# Patient Record
Sex: Female | Born: 1980 | Race: White | Hispanic: No | Smoking: Current every day smoker
Health system: Southern US, Community
[De-identification: ages and names within clinical notes are randomized; demographics above are authoritative.]

## PROBLEM LIST (undated history)

## (undated) DIAGNOSIS — F319 Bipolar disorder, unspecified: Secondary | ICD-10-CM

## (undated) DIAGNOSIS — F329 Major depressive disorder, single episode, unspecified: Secondary | ICD-10-CM

## (undated) DIAGNOSIS — F32A Depression, unspecified: Secondary | ICD-10-CM

## (undated) HISTORY — PX: OTHER SURGICAL HISTORY: SHX169

## (undated) HISTORY — PX: MANDIBLE FRACTURE SURGERY: SHX706

---

## 2001-06-10 ENCOUNTER — Other Ambulatory Visit: Admission: RE | Admit: 2001-06-10 | Discharge: 2001-06-10 | Payer: Self-pay

## 2004-05-09 ENCOUNTER — Other Ambulatory Visit: Admission: RE | Admit: 2004-05-09 | Discharge: 2004-05-09 | Payer: Self-pay

## 2008-08-21 ENCOUNTER — Emergency Department (HOSPITAL_COMMUNITY): Admission: EM | Admit: 2008-08-21 | Discharge: 2008-08-21 | Payer: Self-pay | Admitting: Emergency Medicine

## 2010-03-19 ENCOUNTER — Encounter: Payer: Self-pay | Admitting: *Deleted

## 2010-06-07 IMAGING — CR DG RIBS W/ CHEST 3+V*L*
4 series · 4 of 4 positions shown · non-contrast
Comparison: None.

CLINICAL DATA: Back and rib pain

LEFT RIBS AND CHEST - 3+ VIEW

[view not recorded (1 of 4)]
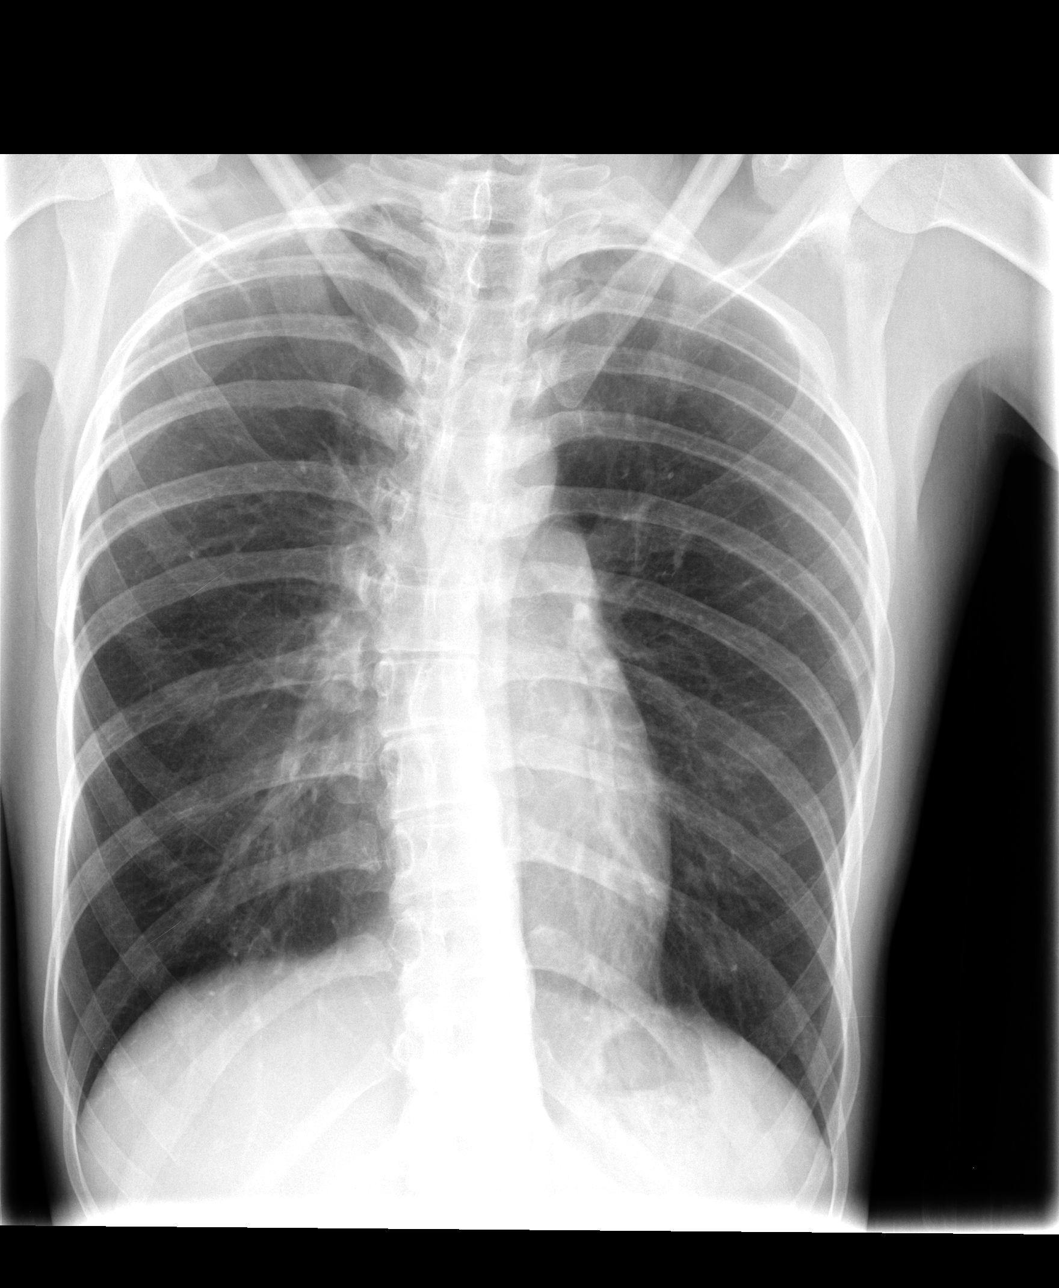

[view not recorded (2 of 4)]
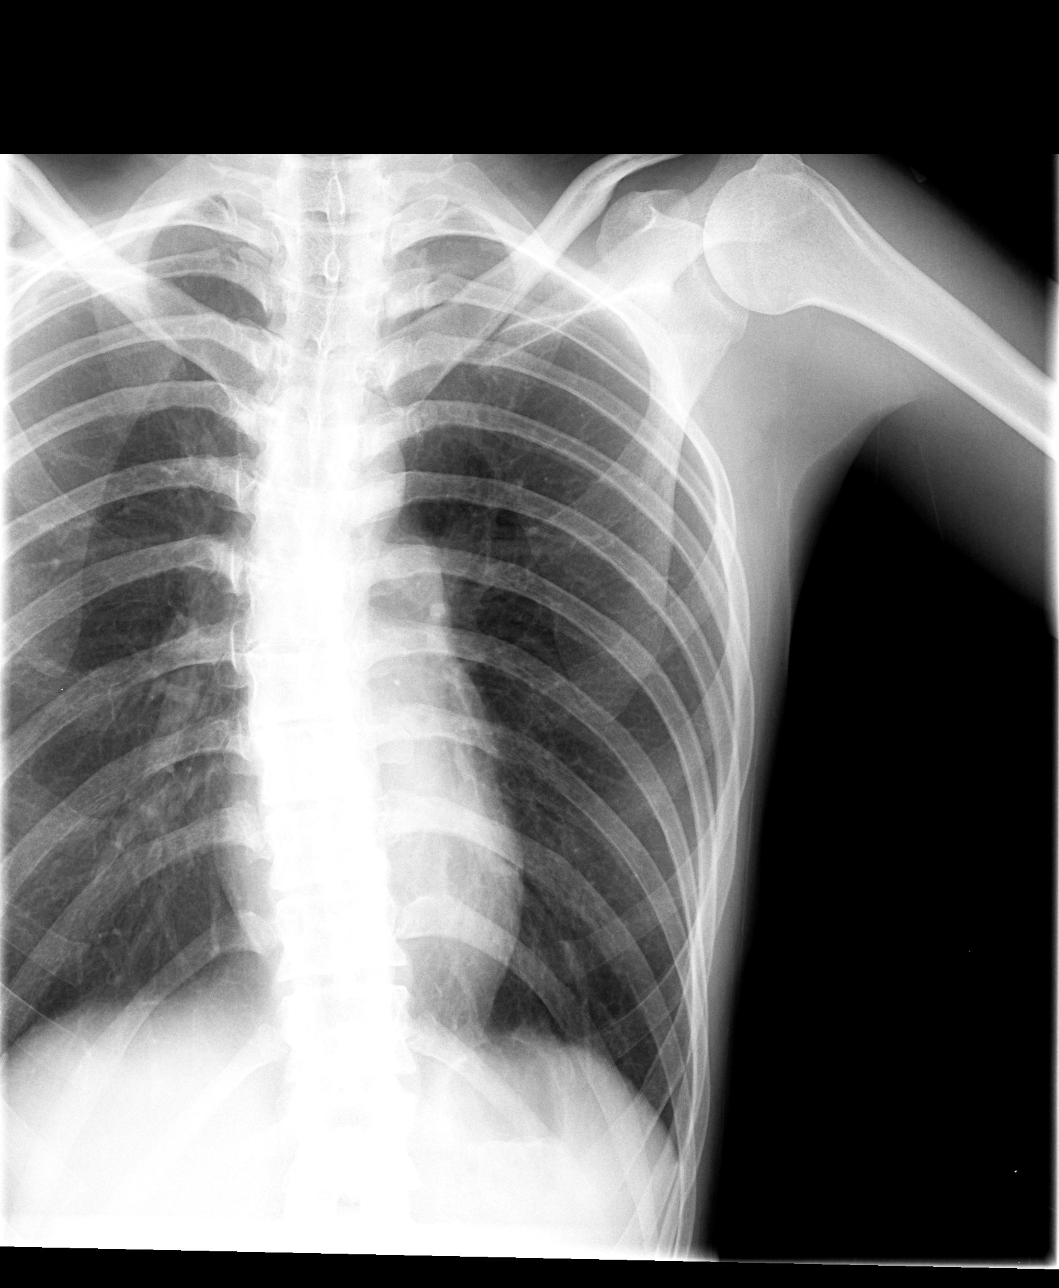

[view not recorded (3 of 4)]
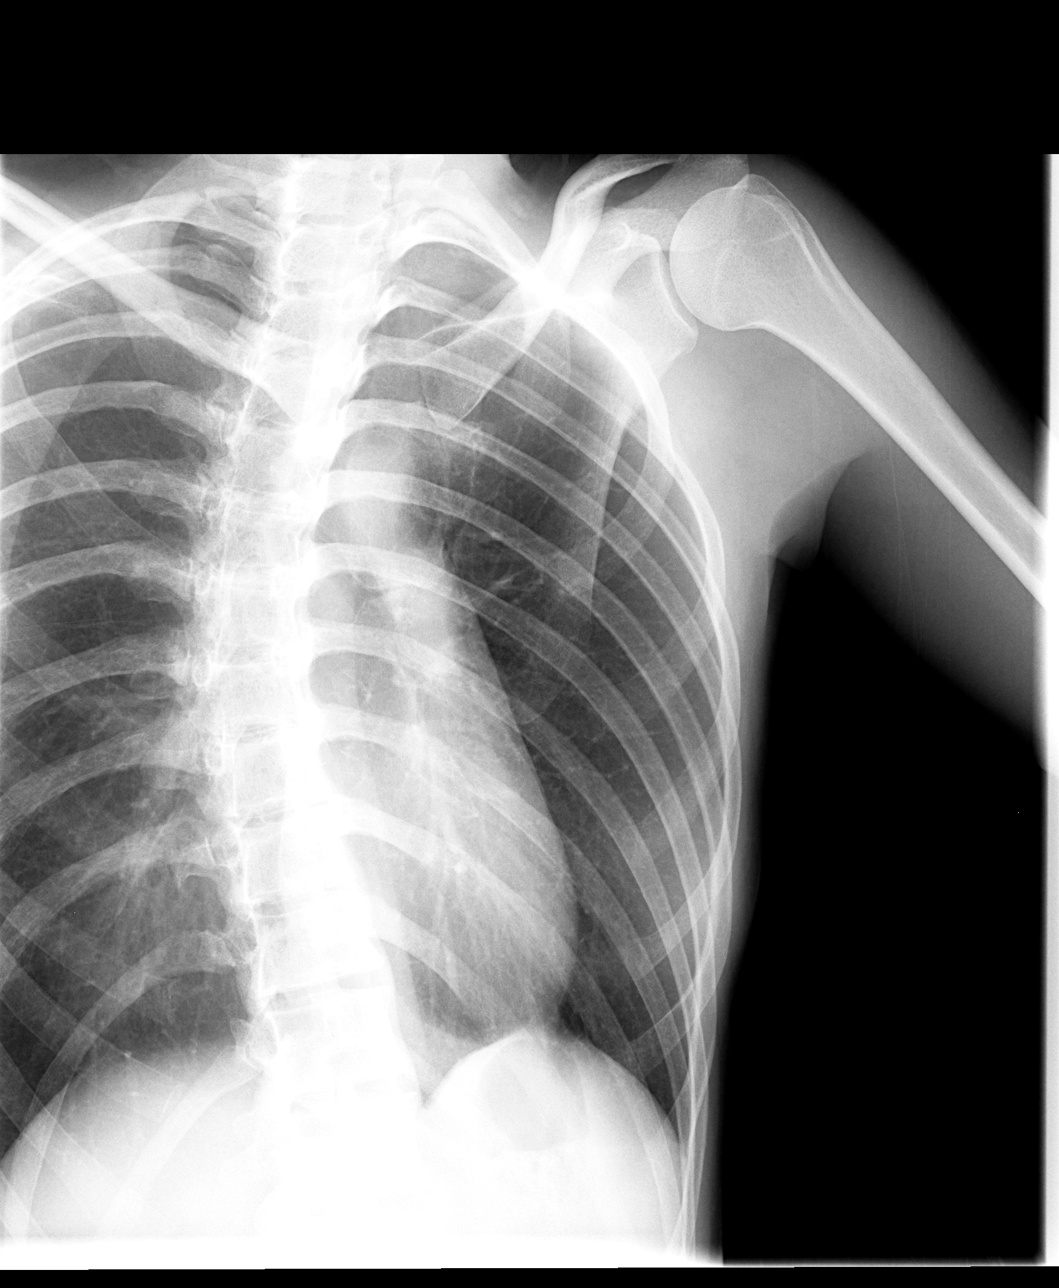

[view not recorded (4 of 4)]
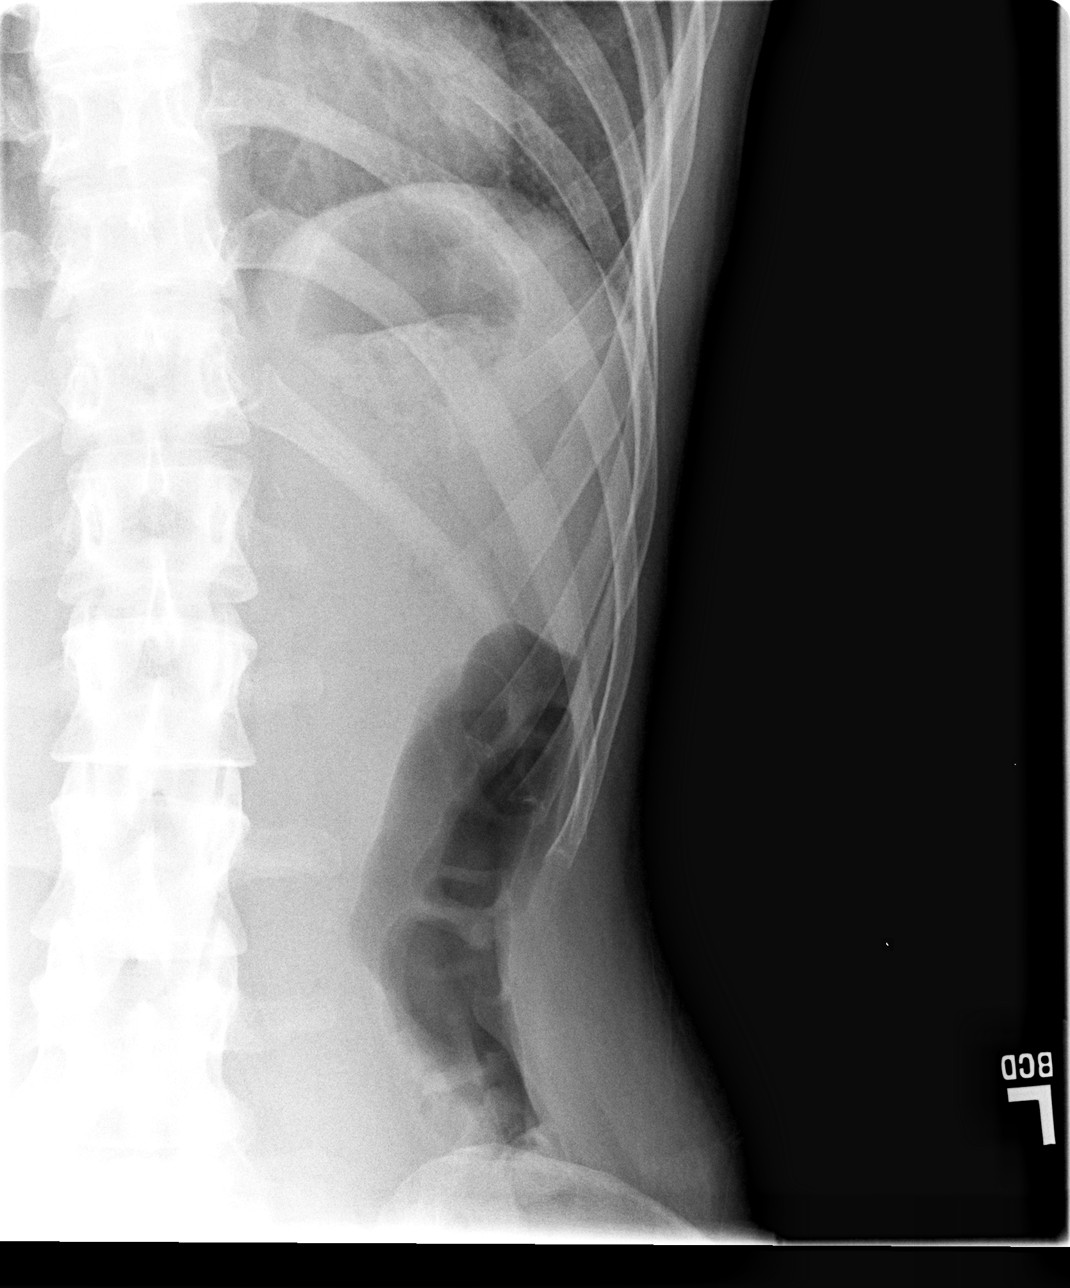

[4 of 4 positions shown; findings below may reference images not displayed]

FINDINGS: Normal heart size and vascularity.  Mild diffuse
hyperinflation.  Negative for pneumonia, edema, effusion or
pneumothorax.

Mild scoliosis noted.  No displaced rib fracture, rib abnormality
or chest wall hematoma.
IMPRESSION: No acute finding.
Hyperinflation.

## 2011-03-06 ENCOUNTER — Emergency Department (HOSPITAL_COMMUNITY)
Admission: EM | Admit: 2011-03-06 | Discharge: 2011-03-06 | Disposition: A | Discharge status: Home / Self Care | Payer: Self-pay | Attending: Emergency Medicine | Admitting: Emergency Medicine

## 2011-03-06 ENCOUNTER — Encounter: Payer: Self-pay | Admitting: *Deleted

## 2011-03-06 DIAGNOSIS — F17200 Nicotine dependence, unspecified, uncomplicated: Secondary | ICD-10-CM | POA: Insufficient documentation

## 2011-03-06 DIAGNOSIS — L299 Pruritus, unspecified: Secondary | ICD-10-CM | POA: Insufficient documentation

## 2011-03-06 DIAGNOSIS — B86 Scabies: Secondary | ICD-10-CM | POA: Insufficient documentation

## 2011-03-06 MED ORDER — PERMETHRIN 5 % EX CREA: TOPICAL_CREAM | CUTANEOUS | Status: AC

## 2011-03-06 NOTE — ED Provider Notes (Signed)
History     CSN: [id number]  Arrival date & time 03/06/11  1725   First MD Initiated Contact with Patient 03/06/11 1803      Chief Complaint  Patient presents with  . Rash    (Consider location/radiation/quality/duration/timing/severity/associated sxs/prior treatment) HPI Comments: Patient c/o persistent, itching rash to most of her body for several weeks to months.  States she noticed the rash after staying with someone who she states "was not very clean".  Rash began on her arms and hands and later spread to rest of her body.  Describes the itching as severe at times.  She denies swelling, vaginal d/c or bleeding   Patient is a 31 y.o. female presenting with rash. The history is provided by the patient.  Rash  This is a new problem. The current episode started more than 1 week ago. The problem has not changed since onset.The problem is associated with an unknown factor. There has been no fever. The rash is present on the torso, back, abdomen, groin, right hand, right foot, left hand, left arm and right arm. The patient is experiencing no pain. Associated symptoms include itching. Pertinent negatives include no blisters, no pain and no weeping. She has tried nothing for the symptoms.    History reviewed. No pertinent past medical history.  Past Surgical History  Procedure Date  . Mandible fracture surgery     History reviewed. No pertinent family history.  History  Substance Use Topics  . Smoking status: Current Everyday Smoker  . Smokeless tobacco: Not on file  . Alcohol Use: Yes    OB History    Grav Para Term Preterm Abortions TAB SAB Ect Mult Living                  Review of Systems  Constitutional: Negative for fever, activity change and appetite change.  Musculoskeletal: Negative for myalgias, joint swelling and arthralgias.  Skin: Positive for itching and rash. Negative for wound.  Hematological: Negative for adenopathy.  All other systems reviewed and are  negative.    Allergies  Review of patient's allergies indicates no known allergies.  Home Medications   Current Outpatient Rx  Name Route Sig Dispense Refill  . ADULT MULTIVITAMIN W/MINERALS CH Oral Take 1 tablet by mouth daily.      Marland Kitchen PENICILLIN V POTASSIUM 500 MG PO TABS Oral Take 500 mg by mouth once. For skin       BP 104/75  Pulse 118  Temp 98.2 F (36.8 C)  Ht 5\' 11"  (1.803 m)  Wt 105 lb (47.628 kg)  BMI 14.64 kg/m2  SpO2 99%  LMP 01/31/2011  Physical Exam  Nursing note and vitals reviewed. Constitutional: She is oriented to person, place, and time. She appears well-developed and well-nourished. No distress.  HENT:  Head: Normocephalic and atraumatic.  Mouth/Throat: Oropharynx is clear and moist.  Neck: Normal range of motion.  Cardiovascular: Normal rate, regular rhythm and normal heart sounds.   Pulmonary/Chest: Effort normal and breath sounds normal. No respiratory distress. She exhibits no tenderness.  Musculoskeletal: Normal range of motion. She exhibits no edema and no tenderness.  Lymphadenopathy:    She has no cervical adenopathy.  Neurological: She is alert and oriented to person, place, and time. No cranial nerve deficit. She exhibits normal muscle tone. Coordination normal.  Skin: Skin is warm and dry. Rash noted. No petechiae and no purpura noted. Rash is maculopapular. Rash is not nodular, not pustular and not urticarial.  Scattered, erythematous pin point papules to most of the body including the web spaces of the fingers.  Palms and sole of the feet are spared.  Few areas of excoriation  Psychiatric: She has a normal mood and affect.    ED Course  Procedures (including critical care time)       MDM    Patient is alert, non-toxic appearing.  Vitals stable. Rash is likely scabies.  Will treat with premethrin.  Advised to return here if the sx's are not improving.         Dereck Agerton L. Zohair Epp, [place] 03/08/11 1311

## 2011-03-06 NOTE — ED Notes (Signed)
Itching rash , generalized

## 2011-03-06 NOTE — ED Notes (Signed)
Pt a/ox4. Resp even and unlabored. NAD at this time. D/C instructions and Rx reviewed with pt. Pt verbalized understanding. Pt ambulated to lobby with steady gate.  

## 2011-03-08 NOTE — ED Provider Notes (Signed)
Medical screening examination/treatment/procedure(s) were performed by non-physician practitioner and as supervising physician I was immediately available for consultation/collaboration.  Kendricks Reap L Twan Harkin, MD 03/08/11 1657 

## 2011-08-09 ENCOUNTER — Encounter (HOSPITAL_COMMUNITY): Payer: Self-pay | Admitting: *Deleted

## 2011-08-09 ENCOUNTER — Emergency Department (HOSPITAL_COMMUNITY): Payer: Self-pay

## 2011-08-09 ENCOUNTER — Emergency Department (HOSPITAL_COMMUNITY)
Admission: EM | Admit: 2011-08-09 | Discharge: 2011-08-09 | Disposition: A | Discharge status: Home / Self Care | Payer: Self-pay | Attending: Physician Assistant | Admitting: Physician Assistant

## 2011-08-09 DIAGNOSIS — M545 Low back pain, unspecified: Secondary | ICD-10-CM | POA: Insufficient documentation

## 2011-08-09 DIAGNOSIS — F17200 Nicotine dependence, unspecified, uncomplicated: Secondary | ICD-10-CM | POA: Insufficient documentation

## 2011-08-09 DIAGNOSIS — IMO0002 Reserved for concepts with insufficient information to code with codable children: Secondary | ICD-10-CM

## 2011-08-09 DIAGNOSIS — S39012A Strain of muscle, fascia and tendon of lower back, initial encounter: Secondary | ICD-10-CM

## 2011-08-09 MED ORDER — BACLOFEN 10 MG PO TABS: 10.0000 mg | ORAL_TABLET | Freq: Three times a day (TID) | ORAL | Status: AC

## 2011-08-09 NOTE — ED Notes (Signed)
Pt states that she fell over a lawnmower yesterday and landed on her left lower back. Swollen and painful.

## 2011-08-09 NOTE — ED Notes (Signed)
Pt has swollen painful area to the lower left back since falling yesterday. Pt alert and oriented x 3. Skin warm and dry. Color pink. No acute distress.

## 2011-08-09 NOTE — ED Provider Notes (Signed)
Medical screening examination/treatment/procedure(s) were performed by non-physician practitioner and as supervising physician I was immediately available for consultation/collaboration.   Siniya Lichty L Talea Manges, MD 08/09/11 1456 

## 2011-08-09 NOTE — ED Provider Notes (Signed)
History     CSN: [id number]  Arrival date & time 08/09/11  0818   None     Chief Complaint  Patient presents with  . Fall    (Consider location/radiation/quality/duration/timing/severity/associated sxs/prior treatment) Patient is a 31 y.o. female presenting with fall. The history is provided by the patient.  Fall The accident occurred yesterday. The fall occurred while walking. She landed on grass. Point of impact: lower back. Pain location: lower back. The pain is moderate. She was ambulatory at the scene. Pertinent negatives include no abdominal pain, no bowel incontinence, no hematuria and no loss of consciousness. The symptoms are aggravated by activity and standing. She has tried NSAIDs for the symptoms.    History reviewed. No pertinent past medical history.  Past Surgical History  Procedure Date  . Mandible fracture surgery   . Tubal ligation   . Cesarean section     History reviewed. No pertinent family history.  History  Substance Use Topics  . Smoking status: Current Everyday Smoker    Types: Cigarettes  . Smokeless tobacco: Not on file  . Alcohol Use: Yes    OB History    Grav Para Term Preterm Abortions TAB SAB Ect Mult Living                  Review of Systems  Constitutional: Negative for activity change.       All ROS Neg except as noted in HPI  HENT: Negative for nosebleeds and neck pain.   Eyes: Negative for photophobia and discharge.  Respiratory: Negative for cough, shortness of breath and wheezing.   Cardiovascular: Negative for chest pain and palpitations.  Gastrointestinal: Negative for abdominal pain, blood in stool and bowel incontinence.  Genitourinary: Negative for dysuria, frequency and hematuria.  Musculoskeletal: Negative for back pain and arthralgias.  Skin: Negative.   Neurological: Negative for dizziness, seizures, loss of consciousness and speech difficulty.  Psychiatric/Behavioral: Negative for hallucinations and confusion.     Allergies  Cherry  Home Medications   Current Outpatient Rx  Name Route Sig Dispense Refill  . IBUPROFEN 200 MG PO TABS Oral Take 200 mg by mouth every 6 (six) hours as needed. For pain    . ADULT MULTIVITAMIN W/MINERALS CH Oral Take 1 tablet by mouth daily.        BP 122/67  Pulse 106  Temp 97.6 F (36.4 C) (Oral)  Resp 18  Ht 5\' 9"  (1.753 m)  Wt 100 lb (45.36 kg)  BMI 14.77 kg/m2  SpO2 100%  LMP 08/08/2011  Physical Exam  Nursing note and vitals reviewed. Constitutional: She is oriented to person, place, and time. She appears well-developed and well-nourished.  Non-toxic appearance.  HENT:  Head: Normocephalic.  Right Ear: Tympanic membrane and external ear normal.  Left Ear: Tympanic membrane and external ear normal.  Eyes: EOM and lids are normal. Pupils are equal, round, and reactive to light.  Neck: Normal range of motion. Neck supple. Carotid bruit is not present.  Cardiovascular: Normal rate, regular rhythm, normal heart sounds, intact distal pulses and normal pulses.   Pulmonary/Chest: Breath sounds normal. No respiratory distress.  Abdominal: Soft. Bowel sounds are normal. There is no tenderness. There is no guarding.  Musculoskeletal: Normal range of motion.       Small cyst of the left lower back.  Lymphadenopathy:       Head (right side): No submandibular adenopathy present.       Head (left side): No submandibular adenopathy present.  She has no cervical adenopathy.  Neurological: She is alert and oriented to person, place, and time. She has normal strength. No cranial nerve deficit or sensory deficit. She exhibits normal muscle tone. Coordination normal.       Gait wNl. No sensory deficit.  Skin: Skin is warm and dry.  Psychiatric: She has a normal mood and affect. Her speech is normal.    ED Course  Procedures (including critical care time)  Labs Reviewed - No data to display Dg Lumbar Spine Complete  08/09/2011  *RADIOLOGY REPORT*  Clinical  Data: History of fall complaining of lower back pain.  LUMBAR SPINE - COMPLETE 4+ VIEW  Comparison: No priors.  Findings: Multiple views of the lumbar spine demonstrate no acute displaced fracture or compression type fractures.  Alignment is anatomic.  Intervertebral disc heights are well preserved.  No significant degenerative changes are appreciated.  IMPRESSION: 1.  No acute radiographic abnormality of the lumbar spine.  Original Report Authenticated By: Florencia Reasons, M.D.     No diagnosis found.    MDM  I have reviewed nursing notes, vital signs, and all appropriate lab and imaging results for this patient. Pt fell while mowing the lawn on yesterday. Pain of the lower back today. Prominent area of the lower back present. Pt's xray is negative for fracture/dislocation. Will treat with warm tub soaks, ibuprofen and baclofen.       Kathie Dike, [place] 08/09/11 [phone]

## 2011-08-09 NOTE — Discharge Instructions (Signed)
Your xrays are negative for fracture or dislocation. Please use warm tub soaks 2 times daily. 3 ibuprofen tabs with each meal. Baclofen three times daily with each meal.Muscle Strain A muscle strain (pulled muscle) happens when a muscle is over-stretched. Recovery usually takes 5 to 6 weeks.  HOME CARE   Put ice on the injured area.   Put ice in a plastic bag.   Place a towel between your skin and the bag.   Leave the ice on for 15 to 20 minutes at a time, every hour for the first 2 days.   Do not use the muscle for several days or until your doctor says you can. Do not use the muscle if you have pain.   Wrap the injured area with an elastic bandage for comfort. Do not put it on too tightly.   Only take medicine as told by your doctor.   Warm up before exercise. This helps prevent muscle strains.  GET HELP RIGHT AWAY IF:  There is increased pain or puffiness (swelling) in the affected area. MAKE SURE YOU:   Understand these instructions.   Will watch your condition.   Will get help right away if you are not doing well or get worse.  Document Released: 11/22/2007 Document Revised: 02/01/2011 Document Reviewed: 11/22/2007 [facility] Patient Information 2012 [place], [place].

## 2011-10-10 ENCOUNTER — Encounter (HOSPITAL_COMMUNITY): Payer: Self-pay | Admitting: *Deleted

## 2011-10-10 ENCOUNTER — Emergency Department (HOSPITAL_COMMUNITY)
Admission: EM | Admit: 2011-10-10 | Discharge: 2011-10-10 | Disposition: A | Discharge status: Home / Self Care | Payer: Self-pay | Attending: Emergency Medicine | Admitting: Emergency Medicine

## 2011-10-10 DIAGNOSIS — B86 Scabies: Secondary | ICD-10-CM | POA: Insufficient documentation

## 2011-10-10 DIAGNOSIS — Z91018 Allergy to other foods: Secondary | ICD-10-CM | POA: Insufficient documentation

## 2011-10-10 DIAGNOSIS — F319 Bipolar disorder, unspecified: Secondary | ICD-10-CM | POA: Insufficient documentation

## 2011-10-10 DIAGNOSIS — F17200 Nicotine dependence, unspecified, uncomplicated: Secondary | ICD-10-CM | POA: Insufficient documentation

## 2011-10-10 HISTORY — DX: Major depressive disorder, single episode, unspecified: F32.9

## 2011-10-10 HISTORY — DX: Depression, unspecified: F32.A

## 2011-10-10 HISTORY — DX: Bipolar disorder, unspecified: F31.9

## 2011-10-10 MED ORDER — PERMETHRIN 5 % EX CREA: TOPICAL_CREAM | CUTANEOUS | Status: AC

## 2011-10-10 NOTE — ED Notes (Signed)
Itching rash to body, hx of scabies.

## 2011-10-10 NOTE — ED Provider Notes (Signed)
History     CSN: [id number]  Arrival date & time 10/10/11  2244   First MD Initiated Contact with Patient 10/10/11 2305      Chief Complaint  Patient presents with  . Rash    (Consider location/radiation/quality/duration/timing/severity/associated sxs/prior treatment) HPI  ADER FRITZE is a 31 y.o. female who presents to the Emergency Department complaining of rash and itching to her feet that began several days ago and is similar to a previous experience with scabies.   Past Medical History  Diagnosis Date  . Bipolar 1 disorder   . Depression     Past Surgical History  Procedure Date  . Mandible fracture surgery   . Tubal ligation   . Cesarean section     History reviewed. No pertinent family history.  History  Substance Use Topics  . Smoking status: Current Everyday Smoker    Types: Cigarettes  . Smokeless tobacco: Not on file  . Alcohol Use: Yes    OB History    Grav Para Term Preterm Abortions TAB SAB Ect Mult Living                  Review of Systems  Constitutional: Negative for fever.       10 Systems reviewed and are negative for acute change except as noted in the HPI.  HENT: Negative for congestion.   Eyes: Negative for discharge and redness.  Respiratory: Negative for cough and shortness of breath.   Cardiovascular: Negative for chest pain.  Gastrointestinal: Negative for vomiting and abdominal pain.  Musculoskeletal: Negative for back pain.  Skin: Negative for rash.       Rash and itching to feet  Neurological: Negative for syncope, numbness and headaches.  Psychiatric/Behavioral:       No behavior change.    Allergies  Cherry  Home Medications   Current Outpatient Rx  Name Route Sig Dispense Refill  . IBUPROFEN 200 MG PO TABS Oral Take 200 mg by mouth every 6 (six) hours as needed. For pain    . ADULT MULTIVITAMIN W/MINERALS CH Oral Take 1 tablet by mouth daily.        BP 99/64  Pulse 81  Temp 97.8 F (36.6 C) (Oral)   Resp 20  Ht 5\' 10"  (1.778 m)  Wt 100 lb (45.36 kg)  BMI 14.35 kg/m2  SpO2 100%  LMP 10/03/2011  Physical Exam  Nursing note and vitals reviewed. Constitutional:       Awake, alert, nontoxic appearance.  HENT:  Head: Atraumatic.  Eyes: Right eye exhibits no discharge. Left eye exhibits no discharge.  Neck: Neck supple.  Cardiovascular: Normal heart sounds.   Pulmonary/Chest: Effort normal and breath sounds normal. She exhibits no tenderness.  Abdominal: Soft. There is no tenderness. There is no rebound.  Musculoskeletal: She exhibits no tenderness.       Baseline ROM, no obvious new focal weakness.  Neurological:       Mental status and motor strength appears baseline for patient and situation.  Skin: No rash noted.       Rash to feet and between toes c/w scabies  Psychiatric: She has a normal mood and affect.    ED Course  Procedures (including critical care time)    MDM  Patient with scabies. Rx for elimite.Pt stable in ED with no significant deterioration in condition.The patient appears reasonably screened and/or stabilized for discharge and I doubt any other medical condition or other [facility] requiring further screening, evaluation, or treatment  in the ED at this time prior to discharge.  MDM Reviewed: nursing note and vitals           Nicoletta Dress. Colon Branch, MD 10/10/11 2324

## 2011-10-10 NOTE — ED Notes (Signed)
Discharge instructions reviewed with pt, questions answered. Pt verbalized understanding.  

## 2012-04-13 ENCOUNTER — Emergency Department (HOSPITAL_COMMUNITY): Payer: Self-pay

## 2012-04-13 ENCOUNTER — Emergency Department (HOSPITAL_COMMUNITY)
Admission: EM | Admit: 2012-04-13 | Discharge: 2012-04-13 | Discharge status: Against Medical Advice | Payer: Self-pay | Attending: Emergency Medicine | Admitting: Emergency Medicine

## 2012-04-13 ENCOUNTER — Encounter (HOSPITAL_COMMUNITY): Payer: Self-pay | Admitting: *Deleted

## 2012-04-13 DIAGNOSIS — Z8659 Personal history of other mental and behavioral disorders: Secondary | ICD-10-CM | POA: Insufficient documentation

## 2012-04-13 DIAGNOSIS — F17200 Nicotine dependence, unspecified, uncomplicated: Secondary | ICD-10-CM | POA: Insufficient documentation

## 2012-04-13 DIAGNOSIS — Y939 Activity, unspecified: Secondary | ICD-10-CM | POA: Insufficient documentation

## 2012-04-13 DIAGNOSIS — Y929 Unspecified place or not applicable: Secondary | ICD-10-CM | POA: Insufficient documentation

## 2012-04-13 DIAGNOSIS — Z87828 Personal history of other (healed) physical injury and trauma: Secondary | ICD-10-CM | POA: Insufficient documentation

## 2012-04-13 DIAGNOSIS — X58XXXA Exposure to other specified factors, initial encounter: Secondary | ICD-10-CM | POA: Insufficient documentation

## 2012-04-13 DIAGNOSIS — S02600A Fracture of unspecified part of body of mandible, initial encounter for closed fracture: Secondary | ICD-10-CM | POA: Insufficient documentation

## 2012-04-13 DIAGNOSIS — Z791 Long term (current) use of non-steroidal anti-inflammatories (NSAID): Secondary | ICD-10-CM | POA: Insufficient documentation

## 2012-04-13 NOTE — ED Notes (Signed)
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

## 2012-04-13 NOTE — ED Provider Notes (Signed)
History     CSN: [id number]  Arrival date & time 04/13/12  1349   First MD Initiated Contact with Patient 04/13/12 1540      Chief Complaint  Patient presents with  . 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 Patient has had no followup for this problem.  She is able to chew and swallow. She has tried over-the-counter meds without relief. No fever,  chills, meningeal signs  Past Medical History  Diagnosis Date  . Bipolar 1 disorder   . Depression     Past Surgical History  Procedure Laterality Date  . Mandible fracture surgery    . Tubal ligation    . Cesarean section      No family history on file.  History  Substance Use Topics  . Smoking status: Current Every Day Smoker    Types: Cigarettes  . Smokeless tobacco: Not on file  . Alcohol Use: Yes    OB History   Grav Para Term Preterm Abortions TAB SAB Ect Mult Living                  Review of Systems  All other systems reviewed and are negative.    Allergies  Cherry  Home Medications   Current Outpatient Rx  Name  Route  Sig  Dispense  Refill  . ibuprofen (ADVIL,MOTRIN) 200 MG tablet   Oral   Take 200 mg by mouth every 6 (six) hours as needed. For pain           BP 107/81  Pulse 94  Temp(Src) 97.7 F (36.5 C) (Oral)  Resp 16  Ht 5\' 11"  (1.803 m)  Wt 120 lb (54.432 kg)  BMI 16.74 kg/m2  SpO2 100%  LMP 04/11/2012  Physical Exam  Constitutional: She is oriented to person, place, and time. She appears well-developed and well-nourished.  HENT:  Slight tenderness just anterior to the angle of the right mandible. Able to [facility]  Musculoskeletal: Normal range of motion.  Neurological: She is alert and oriented to person, place, and time.  Skin: Skin is warm and dry.  Psychiatric: She has a normal mood and affect.    ED Course   Procedures (including critical care time)  Labs Reviewed - No data to display No results found.   No diagnosis found.    MDM  Panorex films not available. Attempt to obtain a CT of maxillofacial area.  Patient left AMA        Donnetta Hutching, MD 04/13/12 [phone]

## 2012-04-13 NOTE — ED Notes (Signed)
Pt left AMA without informing staff of leaving

## 2012-04-13 NOTE — ED Notes (Signed)
Pt with right side of jaw pain, becoming more constant recently, unable to chew per pt, no PCP due to no insurance per pt.

## 2012-04-13 NOTE — ED Notes (Signed)
Pt not in room.

## 2012-07-28 ENCOUNTER — Emergency Department (HOSPITAL_COMMUNITY)
Admission: EM | Admit: 2012-07-28 | Discharge: 2012-07-28 | Disposition: A | Discharge status: Home / Self Care | Payer: Self-pay | Attending: Emergency Medicine | Admitting: Emergency Medicine

## 2012-07-28 ENCOUNTER — Encounter (HOSPITAL_COMMUNITY): Payer: Self-pay | Admitting: *Deleted

## 2012-07-28 DIAGNOSIS — IMO0001 Reserved for inherently not codable concepts without codable children: Secondary | ICD-10-CM | POA: Insufficient documentation

## 2012-07-28 DIAGNOSIS — R350 Frequency of micturition: Secondary | ICD-10-CM | POA: Insufficient documentation

## 2012-07-28 DIAGNOSIS — R109 Unspecified abdominal pain: Secondary | ICD-10-CM | POA: Insufficient documentation

## 2012-07-28 DIAGNOSIS — R3915 Urgency of urination: Secondary | ICD-10-CM | POA: Insufficient documentation

## 2012-07-28 DIAGNOSIS — N390 Urinary tract infection, site not specified: Secondary | ICD-10-CM | POA: Insufficient documentation

## 2012-07-28 DIAGNOSIS — R300 Dysuria: Secondary | ICD-10-CM | POA: Insufficient documentation

## 2012-07-28 DIAGNOSIS — Z3202 Encounter for pregnancy test, result negative: Secondary | ICD-10-CM | POA: Insufficient documentation

## 2012-07-28 DIAGNOSIS — R000 Tachycardia, unspecified: Secondary | ICD-10-CM | POA: Insufficient documentation

## 2012-07-28 DIAGNOSIS — Z8659 Personal history of other mental and behavioral disorders: Secondary | ICD-10-CM | POA: Insufficient documentation

## 2012-07-28 DIAGNOSIS — F17200 Nicotine dependence, unspecified, uncomplicated: Secondary | ICD-10-CM | POA: Insufficient documentation

## 2012-07-28 DIAGNOSIS — R6883 Chills (without fever): Secondary | ICD-10-CM | POA: Insufficient documentation

## 2012-07-28 LAB — URINALYSIS, ROUTINE W REFLEX MICROSCOPIC
Glucose, UA: NEGATIVE mg/dL
Specific Gravity, Urine: 1.02 (ref 1.005–1.030)

## 2012-07-28 LAB — PREGNANCY, URINE: Preg Test, Ur: NEGATIVE

## 2012-07-28 MED ORDER — PHENAZOPYRIDINE HCL 200 MG PO TABS: 200.0000 mg | ORAL_TABLET | Freq: Three times a day (TID) | ORAL | Status: DC

## 2012-07-28 MED ORDER — CEFTRIAXONE SODIUM 250 MG IJ SOLR
250.0000 mg | Freq: Once | INTRAMUSCULAR | Status: AC
  Administered 2012-07-28: 250 mg via INTRAMUSCULAR
  Filled 2012-07-28: qty 250

## 2012-07-28 MED ORDER — LIDOCAINE HCL (PF) 1 % IJ SOLN
INTRAMUSCULAR | Status: AC
  Administered 2012-07-28: 1 mL
  Filled 2012-07-28: qty 5

## 2012-07-28 MED ORDER — CEPHALEXIN 500 MG PO CAPS: 500.0000 mg | ORAL_CAPSULE | Freq: Three times a day (TID) | ORAL | Status: DC

## 2012-07-28 MED ORDER — HYDROCODONE-ACETAMINOPHEN 5-325 MG PO TABS: 1.0000 | ORAL_TABLET | ORAL | Status: DC | PRN

## 2012-07-28 NOTE — ED Notes (Signed)
Low back pain for 2 days, Pain rt lower ant ribs, say she was "elbowed today" in her ribs

## 2012-07-28 NOTE — ED Notes (Signed)
Left in c/o boyfriend for transport home; instructions, prescriptions and f/u information given/reviewed - verbalizes understanding.

## 2012-07-28 NOTE — ED Provider Notes (Signed)
History     CSN: [id number]  Arrival date & time 07/28/12  1522   None     Chief Complaint  Patient presents with  . Back Pain    (Consider location/radiation/quality/duration/timing/severity/associated sxs/prior treatment) Patient is a 32 y.o. female presenting with frequency. The history is provided by the patient.  Urinary Frequency This is a new problem. The current episode started in the past 7 days. The problem has been gradually worsening. Associated symptoms include abdominal pain, chills and myalgias. Pertinent negatives include no nausea or vomiting. Fever: ?    Past Medical History  Diagnosis Date  . Bipolar 1 disorder   . Depression     Past Surgical History  Procedure Laterality Date  . Mandible fracture surgery    . Tubal ligation    . Cesarean section      History reviewed. No pertinent family history.  History  Substance Use Topics  . Smoking status: Current Every Day Smoker    Types: Cigarettes  . Smokeless tobacco: Not on file  . Alcohol Use: Yes    OB History   Grav Para Term Preterm Abortions TAB SAB Ect Mult Living                  Review of Systems  Constitutional: Positive for chills. Fever: ?  Gastrointestinal: Positive for abdominal pain. Negative for nausea and vomiting.  Genitourinary: Positive for dysuria, urgency and frequency. Negative for vaginal bleeding, vaginal discharge and vaginal pain.  Musculoskeletal: Positive for myalgias and back pain.  Psychiatric/Behavioral: Nervous/anxious: hx of depression and bipolar.     Allergies  Cherry  Home Medications   Current Outpatient Rx  Name  Route  Sig  Dispense  Refill  . ibuprofen (ADVIL,MOTRIN) 200 MG tablet   Oral   Take 200 mg by mouth every 6 (six) hours as needed for pain.           BP 111/63  Pulse 104  Temp(Src) 97.8 F (36.6 C) (Oral)  Resp 20  Ht 5\' 10"  (1.778 m)  Wt 111 lb (50.349 kg)  BMI 15.93 kg/m2  SpO2 100%  LMP 07/22/2012  Physical Exam   Nursing note and vitals reviewed. Constitutional: She is oriented to person, place, and time. She appears well-developed and well-nourished.  HENT:  Head: Normocephalic and atraumatic.  Eyes: EOM are normal.  Neck: Neck supple.  Cardiovascular: Tachycardia present.   Pulmonary/Chest: Effort normal and breath sounds normal.  Abdominal: Soft. There is tenderness in the suprapubic area. There is no rebound, no guarding and no CVA tenderness.  Musculoskeletal: Normal range of motion.  Neurological: She is alert and oriented to person, place, and time. No cranial nerve deficit.  Skin: Skin is warm and dry.  Psychiatric: She has a normal mood and affect. Her behavior is normal.    ED Course  Procedures (including critical care time) Results for orders placed during the hospital encounter of 07/28/12 (from the past 24 hour(s))  URINALYSIS, ROUTINE W REFLEX MICROSCOPIC     Status: Abnormal   Collection Time    07/28/12  4:38 PM      Result Value Range   Color, Urine YELLOW  YELLOW   APPearance CLOUDY (*) CLEAR   Specific Gravity, Urine 1.020  1.005 - 1.030   pH 6.0  5.0 - 8.0   Glucose, UA NEGATIVE  NEGATIVE mg/dL   Hgb urine dipstick LARGE (*) NEGATIVE   Bilirubin Urine NEGATIVE  NEGATIVE   Ketones, ur NEGATIVE  NEGATIVE mg/dL   Protein, ur 161 (*) NEGATIVE mg/dL   Urobilinogen, UA 1.0  0.0 - 1.0 mg/dL   Nitrite POSITIVE (*) NEGATIVE   Leukocytes, UA MODERATE (*) NEGATIVE  PREGNANCY, URINE     Status: None   Collection Time    07/28/12  4:38 PM      Result Value Range   Preg Test, Ur NEGATIVE  NEGATIVE  URINE MICROSCOPIC-ADD ON     Status: Abnormal   Collection Time    07/28/12  4:38 PM      Result Value Range   WBC, UA TOO NUMEROUS TO COUNT  <3 WBC/hpf   RBC / HPF 21-50  <3 RBC/hpf   Bacteria, UA MANY (*) RARE     MDM  32 y.o. female with UTI, denies vaginal discharge or vaginal problems. Will treat with antibiotics and she is to follow up with her PCP. Urine sent for  culture.  I have reviewed this patient's vital signs, nurses notes, appropriate labs and discussed findings and plan of care with the patient and se voices understanding.    Medication List    TAKE these medications       cephALEXin 500 MG capsule  Commonly known as:  KEFLEX  Take 1 capsule (500 mg total) by mouth 3 (three) times daily.     HYDROcodone-acetaminophen 5-325 MG per tablet  Commonly known as:  NORCO/VICODIN  Take 1 tablet by mouth every 4 (four) hours as needed.     phenazopyridine 200 MG tablet  Commonly known as:  PYRIDIUM  Take 1 tablet (200 mg total) by mouth 3 (three) times daily.      ASK your doctor about these medications       ibuprofen 200 MG tablet  Commonly known as:  ADVIL,MOTRIN  Take 200 mg by mouth every 6 (six) hours as needed for pain.               [facility] Orlene Och, [place] 07/29/12 [phone]

## 2012-07-29 LAB — URINE CULTURE: Colony Count: >=100000

## 2012-07-29 NOTE — ED Provider Notes (Signed)
Medical screening examination/treatment/procedure(s) were performed by non-physician practitioner and as supervising physician I was immediately available for consultation/collaboration.  Velinda Wrobel, MD 07/29/12 1516 

## 2012-08-25 ENCOUNTER — Emergency Department (HOSPITAL_COMMUNITY)
Admission: EM | Admit: 2012-08-25 | Discharge: 2012-08-25 | Disposition: A | Discharge status: Home / Self Care | Payer: Self-pay | Attending: Emergency Medicine | Admitting: Emergency Medicine

## 2012-08-25 ENCOUNTER — Encounter (HOSPITAL_COMMUNITY): Payer: Self-pay | Admitting: *Deleted

## 2012-08-25 DIAGNOSIS — M545 Low back pain, unspecified: Secondary | ICD-10-CM | POA: Insufficient documentation

## 2012-08-25 DIAGNOSIS — Z87440 Personal history of urinary (tract) infections: Secondary | ICD-10-CM | POA: Insufficient documentation

## 2012-08-25 DIAGNOSIS — N12 Tubulo-interstitial nephritis, not specified as acute or chronic: Secondary | ICD-10-CM | POA: Insufficient documentation

## 2012-08-25 DIAGNOSIS — Z8659 Personal history of other mental and behavioral disorders: Secondary | ICD-10-CM | POA: Insufficient documentation

## 2012-08-25 DIAGNOSIS — Z3202 Encounter for pregnancy test, result negative: Secondary | ICD-10-CM | POA: Insufficient documentation

## 2012-08-25 DIAGNOSIS — R5383 Other fatigue: Secondary | ICD-10-CM | POA: Insufficient documentation

## 2012-08-25 DIAGNOSIS — R5381 Other malaise: Secondary | ICD-10-CM | POA: Insufficient documentation

## 2012-08-25 DIAGNOSIS — R350 Frequency of micturition: Secondary | ICD-10-CM | POA: Insufficient documentation

## 2012-08-25 DIAGNOSIS — F17200 Nicotine dependence, unspecified, uncomplicated: Secondary | ICD-10-CM | POA: Insufficient documentation

## 2012-08-25 LAB — URINALYSIS, ROUTINE W REFLEX MICROSCOPIC
Glucose, UA: NEGATIVE mg/dL
Protein, ur: NEGATIVE mg/dL
pH: 5.5 (ref 5.0–8.0)

## 2012-08-25 LAB — URINE MICROSCOPIC-ADD ON

## 2012-08-25 LAB — PREGNANCY, URINE: Preg Test, Ur: NEGATIVE

## 2012-08-25 MED ORDER — CIPROFLOXACIN HCL 500 MG PO TABS: 500.0000 mg | ORAL_TABLET | Freq: Two times a day (BID) | ORAL | Status: DC

## 2012-08-25 MED ORDER — CIPROFLOXACIN HCL 250 MG PO TABS
500.0000 mg | ORAL_TABLET | Freq: Once | ORAL | Status: AC
  Administered 2012-08-25: 500 mg via ORAL
  Filled 2012-08-25: qty 2

## 2012-08-25 NOTE — ED Provider Notes (Signed)
History    CSN: [id number] Arrival date & time 08/25/12  1018  First MD Initiated Contact with Patient 08/25/12 1134     Chief Complaint  Patient presents with  . Generalized Body Aches   (Consider location/radiation/quality/duration/timing/severity/associated sxs/prior Treatment) HPI Comments: Kaitlin Arroyo is a 32 y.o. female who presents to the Emergency Department complaining of generalized body aches and fatigue for 2 days.  Fever began yesterday.  States she feels like she has the "flu".  She denies vomiting, abd pain, sore throat, cough or nasal congestion, rash or recent tick bite.  States she was recently treated for a urinary tract infection but finished the prescribed antibiotics.  She denies dysuria, vaginal pain or discharge.    The history is provided by the patient.   Past Medical History  Diagnosis Date  . Bipolar 1 disorder   . Depression    Past Surgical History  Procedure Laterality Date  . Mandible fracture surgery    . Tubal ligation    . Cesarean section     No family history on file. History  Substance Use Topics  . Smoking status: Current Every Day Smoker    Types: Cigarettes  . Smokeless tobacco: Not on file  . Alcohol Use: Yes   OB History   Grav Para Term Preterm Abortions TAB SAB Ect Mult Living                 Review of Systems  Constitutional: Positive for fatigue. Negative for fever, activity change and appetite change.  HENT: Negative for ear pain, sore throat and trouble swallowing.   Eyes: Negative for visual disturbance.  Respiratory: Negative for cough and shortness of breath.   Cardiovascular: Negative for chest pain.  Gastrointestinal: Negative for vomiting, abdominal pain, constipation and abdominal distention.  Genitourinary: Positive for frequency. Negative for dysuria, hematuria, flank pain, decreased urine volume, vaginal bleeding, vaginal discharge, difficulty urinating and genital sores.       No perineal numbness  or incontinence of urine or feces  Musculoskeletal: Positive for myalgias and back pain. Negative for joint swelling.  Skin: Negative for rash.  Neurological: Negative for dizziness, syncope, weakness and numbness.  All other systems reviewed and are negative.    Allergies  Cherry  Home Medications   Current Outpatient Rx  Name  Route  Sig  Dispense  Refill  . acetaminophen (TYLENOL) 500 MG tablet   Oral   Take 1,000 mg by mouth every 6 (six) hours as needed for pain.          BP 114/70  Pulse 109  Temp(Src) 99.7 F (37.6 C) (Oral)  Resp 16  Ht 5\' 10"  (1.778 m)  Wt 112 lb (50.803 kg)  BMI 16.07 kg/m2  SpO2 98%  LMP 07/15/2012 Physical Exam  Nursing note and vitals reviewed. Constitutional: She is oriented to person, place, and time. She appears well-developed and well-nourished. No distress.  HENT:  Head: Normocephalic and atraumatic.  Right Ear: Tympanic membrane and ear canal normal.  Left Ear: Tympanic membrane and ear canal normal.  Mouth/Throat: Uvula is midline, oropharynx is clear and moist and mucous membranes are normal. No tonsillar abscesses.  Neck: Normal range of motion. Neck supple.  Cardiovascular: Normal rate, regular rhythm, normal heart sounds and intact distal pulses.   No murmur heard. Pulmonary/Chest: Effort normal and breath sounds normal. No respiratory distress. She exhibits no tenderness.  Abdominal: Soft. Normal appearance. She exhibits no distension and no mass. There is no  hepatosplenomegaly. There is no tenderness. There is no rebound, no guarding and no CVA tenderness.  Musculoskeletal: She exhibits tenderness. She exhibits no edema.       Lumbar back: She exhibits tenderness and pain. She exhibits normal range of motion, no bony tenderness, no swelling, no deformity, no laceration and normal pulse.       Back:  ttp of the lumbar paraspinal muscles.  No spinal tenderness.  DP pulses are brisk and symmetrical.  Distal sensation intact.   Hip Flexors/Extensors are intact  Neurological: She is alert and oriented to person, place, and time. No cranial nerve deficit or sensory deficit. She exhibits normal muscle tone. Coordination and gait normal.  Reflex Scores:      Patellar reflexes are 2+ on the right side and 2+ on the left side.      Achilles reflexes are 2+ on the right side and 2+ on the left side. Skin: Skin is warm and dry.    ED Course  Procedures (including critical care time) Labs Reviewed  URINALYSIS, ROUTINE W REFLEX MICROSCOPIC - Abnormal; Notable for the following:    Hgb urine dipstick SMALL (*)    Urobilinogen, UA 4.0 (*)    Nitrite POSITIVE (*)    Leukocytes, UA TRACE (*)    All other components within normal limits  URINE MICROSCOPIC-ADD ON - Abnormal; Notable for the following:    Bacteria, UA MANY (*)    All other components within normal limits  PREGNANCY, URINE     MDM     Previous ED chart and urine culture reviewed.  Previous urine C&S showed sensitivity to cipro.  Patient sx's likely related to early pyelonephritis.  Fever is low grade, diffuse low back pain, no abd pain or vomiting.  Will prescribe cipro and she agrees to close f/u with her PMD or return here if sx's not improving .  VSS.  She appears stable for discharge  Isaih Bulger L. Trisha Mangle, PA-C 08/26/12 2154

## 2012-08-25 NOTE — ED Notes (Signed)
Fever, body aches since yesterday.

## 2012-08-27 NOTE — ED Provider Notes (Signed)
Medical screening examination/treatment/procedure(s) were performed by non-physician practitioner and as supervising physician I was immediately available for consultation/collaboration. Devoria Albe, MD, Armando Gang   Ward Givens, MD 08/27/12 [phone]

## 2015-06-14 ENCOUNTER — Emergency Department (HOSPITAL_COMMUNITY)
Admission: EM | Admit: 2015-06-14 | Discharge: 2015-06-14 | Disposition: A | Discharge status: Home / Self Care | Payer: Self-pay | Attending: Emergency Medicine | Admitting: Emergency Medicine

## 2015-06-14 ENCOUNTER — Encounter (HOSPITAL_COMMUNITY): Payer: Self-pay | Admitting: *Deleted

## 2015-06-14 DIAGNOSIS — F17210 Nicotine dependence, cigarettes, uncomplicated: Secondary | ICD-10-CM | POA: Insufficient documentation

## 2015-06-14 DIAGNOSIS — F319 Bipolar disorder, unspecified: Secondary | ICD-10-CM | POA: Insufficient documentation

## 2015-06-14 DIAGNOSIS — K047 Periapical abscess without sinus: Secondary | ICD-10-CM | POA: Insufficient documentation

## 2015-06-14 DIAGNOSIS — Z79899 Other long term (current) drug therapy: Secondary | ICD-10-CM | POA: Insufficient documentation

## 2015-06-14 MED ORDER — IBUPROFEN 800 MG PO TABS: 800.0000 mg | ORAL_TABLET | Freq: Three times a day (TID) | ORAL | Status: AC

## 2015-06-14 MED ORDER — CLINDAMYCIN HCL 150 MG PO CAPS: 300.0000 mg | ORAL_CAPSULE | Freq: Four times a day (QID) | ORAL | Status: AC

## 2015-06-14 MED ORDER — CLINDAMYCIN HCL 150 MG PO CAPS
300.0000 mg | ORAL_CAPSULE | Freq: Once | ORAL | Status: AC
  Administered 2015-06-14: 300 mg via ORAL
  Filled 2015-06-14: qty 2

## 2015-06-14 NOTE — ED Provider Notes (Signed)
CSN: [id number]     Arrival date & time 06/14/15  [id number] History   First MD Initiated Contact with Patient 06/14/15 1009     Chief Complaint  Patient presents with  . Jaw Pain     (Consider location/radiation/quality/duration/timing/severity/associated sxs/prior Treatment) HPI  Kaitlin Arroyo is a 35 y.o. female who presents to the Emergency Department complaining of right lower jaw pain, facial swelling and dental pain.  Onset yesterday.  She reports having a "plate" in her jaw from an injury several years ago and is concerned that the plate has loosened.  She reports pain with chewing, hot and cold sensitivity and occasional pain that radiates to her face and right ear.  She has tried tylenol without relief.  She denies fever, chills, neck pain, difficulty swallowing     Past Medical History  Diagnosis Date  . Bipolar 1 disorder (HCC)   . Depression    Past Surgical History  Procedure Laterality Date  . Mandible fracture surgery    . Tubal ligation    . Cesarean section     History reviewed. No pertinent family history. Social History  Substance Use Topics  . Smoking status: Current Every Day Smoker    Types: Cigarettes  . Smokeless tobacco: None  . Alcohol Use: Yes   OB History    No data available     Review of Systems  Constitutional: Negative for fever and appetite change.  HENT: Positive for dental problem. Negative for congestion, facial swelling, sore throat and trouble swallowing.   Eyes: Negative for pain and visual disturbance.  Musculoskeletal: Negative for neck pain and neck stiffness.  Neurological: Negative for dizziness, facial asymmetry and headaches.  Hematological: Negative for adenopathy.  All other systems reviewed and are negative.     Allergies  Cherry  Home Medications   Prior to Admission medications   Medication Sig Start Date End Date Taking? Authorizing Provider  acetaminophen (TYLENOL) 500 MG tablet Take 1,000 mg by mouth  every 6 (six) hours as needed for pain.   Yes Historical Provider, MD  naproxen sodium (ANAPROX) 220 MG tablet Take 440 mg by mouth 2 (two) times daily with a meal.   Yes Historical Provider, MD   BP 109/79 mmHg  Pulse 80  Temp(Src) 98.5 F (36.9 C) (Oral)  Resp 16  Ht 5\' 10"  (1.778 m)  Wt 61.236 kg  BMI 19.37 kg/m2  SpO2 98%  LMP 06/13/2015 Physical Exam  Constitutional: She is oriented to person, place, and time. She appears well-developed and well-nourished. No distress.  HENT:  Head: Normocephalic and atraumatic.  Right Ear: Tympanic membrane and ear canal normal.  Left Ear: Tympanic membrane and ear canal normal.  Mouth/Throat: Uvula is midline, oropharynx is clear and moist and mucous membranes are normal. No trismus in the jaw. Dental caries present. No dental abscesses or uvula swelling.  Tenderness of the gingiva and dental caries of the right lower lateral incisor, cuspid and first molar. Mild gingival erythema.   Mild lower right facial swelling, no obvious dental abscess, trismus, or sublingual abnml.    Neck: Normal range of motion. Neck supple. No tracheal deviation present.  Cardiovascular: Normal rate, regular rhythm and normal heart sounds.   No murmur heard. Pulmonary/Chest: Effort normal and breath sounds normal.  Musculoskeletal: Normal range of motion.  Lymphadenopathy:    She has no cervical adenopathy.  Neurological: She is alert and oriented to person, place, and time. She exhibits normal muscle tone. Coordination normal.  Skin: Skin is warm and dry.  Nursing note and vitals reviewed.   ED Course  Procedures (including critical care time) Labs Review Labs Reviewed - No data to display  Imaging Review No results found. I have personally reviewed and evaluated these images and lab results as part of my medical decision-making.   EKG Interpretation None      MDM   Final diagnoses:  Abscess, dental    Pt is well appearing.  Multiple dental  caries and likely dental infection w/o obvious abscess at present.  No concerning sx's for Ludwig's angina.  Referral info given for local dentists.  She agrees to abx, ibuprofen for pain and close dental f/u,.  Without hx of recent injury, neck pain,swelling or fever.  I feel that pt's sx's are likely related to her poor dental hygiene and decay.    Pauline Aus, PA-C 06/14/15 1053  Zadie Rhine, MD 06/14/15 [phone]

## 2015-06-14 NOTE — Discharge Instructions (Signed)
Dental Abscess A dental abscess is pus in or around a tooth. HOME CARE  Take medicines only as told by your dentist.  If you were prescribed antibiotic medicine, finish all of it even if you start to feel better.  Rinse your mouth (gargle) often with salt water.  Do not drive or use heavy machinery, like a lawn mower, while taking pain medicine.  Do not apply heat to the outside of your mouth.  Keep all follow-up visits as told by your dentist. This is important. GET HELP IF:  Your pain is worse, and medicine does not help. GET HELP RIGHT AWAY IF:  You have a fever or chills.  Your symptoms suddenly get worse.  You have a very bad headache.  You have problems breathing or swallowing.  You have trouble opening your mouth.  You have puffiness (swelling) in your neck or around your eye.   This information is not intended to replace advice given to you by your health care provider. Make sure you discuss any questions you have with your health care provider.   Document Released: 06/29/2014 Document Reviewed: 06/29/2014 Elsevier Interactive Patient Education 2016 [employer] Resource Guide Dental The United [employer] 211 is a great source of information about community services available.  Access by dialing 2-1-1 from anywhere in [place], or by website -  PooledIncome.plwww.nc211.org.   Other Local Resources (Updated 02/2015)  Dental  Care   Services    Phone Number and Address  Cost  [facility] For children 210 - 36 years of age:   Cleaning  Tooth brushing/flossing instruction  Sealants, fillings, crowns  Extractions  Emergency treatment  [phone] [place], [place] [address] Charges based on family income.  Medicaid and some insurance plans accepted.     [facility]  Cleaning  Sealants, fillings, crowns  Extractions  Emergency treatment [phone] [place], [place]  Pregnant women 36 years of age or older with a Medicaid card  [facility] [place]  Cleaning  Sealants, fillings, crowns  Extractions  Emergency treatment [phone] [address] [place], [place] Pregnant women 36 years of age or older with a Medicaid card  [facility] For children 610 - 36 years of age:   Cleaning  Tooth brushing/flossing instruction  Sealants, fillings, crowns  Extractions  Emergency treatment Limited orthodontic services for patients with Medicaid [phone] [address] [place], [place] [address] Medicaid and [facility] Health Choice cover for children up to age 36 and pregnant women.  Parents of children up to age 36 without Medicaid pay a reduced fee at time of service.  [facility] [place] For children 240 - 36 years of age:   Cleaning  Tooth brushing/flossing instruction  Sealants, fillings, crowns  Extractions  Emergency treatment Limited orthodontic services for patients with Medicaid [phone] [address] [place], [place].  Medicaid and [place] Health Choice cover for children up to age 36 and pregnant women.  Parents of children up to age 36 without Medicaid pay a reduced fee.  [facility]  Cleaning  Sealants, fillings, crowns  Extractions  Hours: Tuesdays and Thursdays, 4:15 - 8 pm [phone] [address], [address] [place], [place] [address] Services free of charge to [facility] residents ages 18-64 who do  not have health insurance, Medicare, Medicaid, or VA benefits and fall within federal poverty guidelines  [facility]    Provides dental care in addition to primary medical care, nutritional counseling, and pharmacy:  Cleaning  Sealants, fillings, crowns  Extractions                   [phone] [facility], [address] [place], [place]  [phone] [facility], [address] [place], [place]  [phone] [facility] [place], [place]  [phone] [facility], [address] [place], [place]  [phone] [facility] [address] [place], [place] Accepts [place], [place], most insurance.  Also provides services available to all with fees adjusted based on ability to pay.    [facility]  Cleaning  Tooth brushing/flossing instruction  Sealants, fillings, crowns  Extractions  Emergency treatment Hours: Tuesdays, Thursdays, and Fridays from 8 am to 5 pm by appointment only. [phone] [address] [place], [place] [address] [facility] residents with Medicaid (depending on eligibility) and children with [facility] Health Choice - call for more information.  [facility]  Extractions only  Hours: 2nd and 4th Thursday of each month from 6:30 am - 9 am.   [phone] [place], [place] [address] Ages 4518 and older only.  Patients are seen on a first come, first served basis.  [employer]  [employer]  Fillings  Extractions  Orthodontics  Endodontics  Implants/Crowns/Bridges  Complete and partial dentures [phone] [place], [place] Patients must complete an application for services.  There is often a waiting list.

## 2015-06-14 NOTE — ED Notes (Signed)
Patient reports hx of steel plate to lower jaw. States yesterday, she noticed swelling to right lower jaw and is having headaches.
